# Patient Record
Sex: Male | Born: 1955 | Race: White | Hispanic: No | Marital: Married | Smoking: Never smoker
Health system: Southern US, Community
[De-identification: ages and names within clinical notes are randomized; demographics above are authoritative.]

## PROBLEM LIST (undated history)

## (undated) DIAGNOSIS — I10 Essential (primary) hypertension: Secondary | ICD-10-CM

## (undated) DIAGNOSIS — E0590 Thyrotoxicosis, unspecified without thyrotoxic crisis or storm: Secondary | ICD-10-CM

## (undated) DIAGNOSIS — E785 Hyperlipidemia, unspecified: Secondary | ICD-10-CM

## (undated) HISTORY — DX: Thyrotoxicosis, unspecified without thyrotoxic crisis or storm: E05.90

## (undated) HISTORY — DX: Hyperlipidemia, unspecified: E78.5

## (undated) HISTORY — PX: DENTAL SURGERY: SHX609

## (undated) HISTORY — DX: Essential (primary) hypertension: I10

---

## 2005-04-14 ENCOUNTER — Ambulatory Visit (HOSPITAL_COMMUNITY): Admission: RE | Admit: 2005-04-14 | Discharge: 2005-04-16 | Payer: Self-pay | Admitting: Neurological Surgery

## 2005-11-14 HISTORY — PX: BACK SURGERY: SHX140

## 2012-12-11 ENCOUNTER — Encounter: Payer: Self-pay | Admitting: Pulmonary Disease

## 2012-12-11 ENCOUNTER — Ambulatory Visit (INDEPENDENT_AMBULATORY_CARE_PROVIDER_SITE_OTHER): Payer: 59 | Admitting: Pulmonary Disease

## 2012-12-11 VITALS — BP 112/70 | HR 75 | Temp 97.8°F | Ht 67.5 in | Wt 216.8 lb

## 2012-12-11 DIAGNOSIS — G4733 Obstructive sleep apnea (adult) (pediatric): Secondary | ICD-10-CM | POA: Insufficient documentation

## 2012-12-11 NOTE — Assessment & Plan Note (Signed)
The patient's history is very suspicious for clinically significant sleep apnea.  I had a long discussion with him about the pathophysiology of sleep apnea, including its impact to his quality of life and cardiovascular health.  I think it is very appropriate for him to have a sleep study at this time, and the patient is agreeable.

## 2012-12-11 NOTE — Patient Instructions (Addendum)
Will schedule for a sleep study.  Will arrange followup once results are available. Work on weight loss
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
## 2013-01-07 ENCOUNTER — Ambulatory Visit (HOSPITAL_BASED_OUTPATIENT_CLINIC_OR_DEPARTMENT_OTHER): Payer: 59

## 2013-01-11 ENCOUNTER — Telehealth: Payer: Self-pay | Admitting: Pulmonary Disease
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
## 2013-01-14 ENCOUNTER — Encounter (HOSPITAL_BASED_OUTPATIENT_CLINIC_OR_DEPARTMENT_OTHER): Payer: 59

## 2013-01-14 NOTE — Telephone Encounter (Signed)
Called and spoke with [facility] and set up a peer to peer appeal to be done by Rubye Oaks, NP today, between 11:30 - 12:30 est. [facility] Medical Director will contact [phone]. Reference # [id number].  Pt's wife is aware of the appeal and that we will contact her once the decision has been made by [facility]. Rhonda J Cobb

## 2013-01-14 NOTE — Telephone Encounter (Signed)
Called [employer] and re-arranged appeal time from 3:00 pm to 4:00 pm to work better with Rubye Oaks, NP schedule. Rhonda J Cobb

## 2013-01-14 NOTE — Telephone Encounter (Signed)
Peer to peer done Denied per TP Will forward back Alexander Jones I still have the paperwork

## 2013-01-15 NOTE — Telephone Encounter (Signed)
Let pt know about this.  We will do a home sleep test, BUT IF THIS IS NEGATIVE, he will need to have an in lab study for better accuracy, or I will not be able to rule out sleep apnea as a cause for his event.

## 2013-01-15 NOTE — Telephone Encounter (Signed)
Dr. Shelle Iron, Tammy P did the appeal with Dr. Pierre Bali (medical director at [facility]) and the in lab study ([employer]) was still denied after listing all the reasons why we were requesting the study to be preformed in the lab setting. Dr. Franky Macho stated that we could proceed with the home study and then send patient to the lab for CPAP titration.  Please advise. Thanks, Ollen Gross

## 2013-01-15 NOTE — Telephone Encounter (Signed)
Spoke with patient and he requested that I call [employer] and get the benefits of the home study and call him back to schedule. Explained to the patient that if we did the home sleep study and the home study didn't pick up any apnea events that we would need to further evaluate with a in lab study. If this is needed, and the home study was inclusive, then we could call [facility] and request the lab study. Pt understands and phone note sent back to Dr. Shelle Iron to order the home study. Will contact patient tomorrow to arrange. Rhonda J Cobb

## 2013-01-16 ENCOUNTER — Other Ambulatory Visit: Payer: Self-pay | Admitting: Pulmonary Disease

## 2013-01-16 DIAGNOSIS — G4733 Obstructive sleep apnea (adult) (pediatric): Secondary | ICD-10-CM

## 2013-01-22 NOTE — Telephone Encounter (Signed)
Per Bjorn Loser this has been taken care of

## 2013-01-22 NOTE — Telephone Encounter (Signed)
Spoke with pt's wife and patient. Pt concerned about cost of the studies. However, patient will pick up the home sleep study device, as per his [facility] has advised that we arrange on Monday 01/28/13. Advised patient and wife that hopefully this device will tell us if he has sleep apnea. If the study is negative, pt and wife have been informed that a in lab study may be necessary to evaluate any other explained events. At that point, we would contact [facility] and request in lab study. Nothing further needed at this time. Rhonda J Cobb

## 2013-02-01 ENCOUNTER — Telehealth: Payer: Self-pay | Admitting: Pulmonary Disease

## 2013-02-01 NOTE — Telephone Encounter (Signed)
Wife wanted to confirm the appointment to pick up the [facility] on Monday. Advised patient that we have patient scheduled to pick up the device on Monday 02/04/13 after 1:00. Nothing else needed. Rhonda J Cobb

## 2013-02-11 ENCOUNTER — Ambulatory Visit (INDEPENDENT_AMBULATORY_CARE_PROVIDER_SITE_OTHER): Payer: 59 | Admitting: Pulmonary Disease

## 2013-02-11 DIAGNOSIS — G4733 Obstructive sleep apnea (adult) (pediatric): Secondary | ICD-10-CM

## 2013-02-18 ENCOUNTER — Telehealth: Payer: Self-pay | Admitting: Pulmonary Disease

## 2013-02-18 NOTE — Telephone Encounter (Signed)
I spoke with spouse. She is wanting to know if [facility] [facility] results have been received. Please advise KC thanks

## 2013-02-19 NOTE — Telephone Encounter (Signed)
Pt needs ov asap to review sleep study.  ?this week?

## 2013-02-19 NOTE — Telephone Encounter (Signed)
Dr Shelle Iron please advise, thanks!

## 2013-02-20 ENCOUNTER — Telehealth: Payer: Self-pay | Admitting: Pulmonary Disease

## 2013-02-20 NOTE — Telephone Encounter (Signed)
Patient scheduled for 02/26/13 at 430 with KC--appt time okay with KC.

## 2013-02-20 NOTE — Telephone Encounter (Signed)
Spoke with the pt's spouse She states that the pt may not be able to afford his copay next wk when he comes in I advised that he can ask the girls up front to just bill his insurance for this She is still unsure if he will want to keep the appt, but will check with him and call us tomorrow Nothing further needed per spouse

## 2013-02-20 NOTE — Telephone Encounter (Signed)
Wife returning call.  She states patient has Tuesday's off and can only come in then.  Please advise.  [phone]

## 2013-02-20 NOTE — Telephone Encounter (Signed)
[id number]. KC had cancellation today, if this is not available when pt calls back then will need to check with ashtyn on work-in. Carron Curie, CMA

## 2013-02-20 NOTE — Telephone Encounter (Signed)
Please advise ashtyn thanks 

## 2013-02-26 ENCOUNTER — Ambulatory Visit (INDEPENDENT_AMBULATORY_CARE_PROVIDER_SITE_OTHER): Payer: 59 | Admitting: Pulmonary Disease

## 2013-02-26 ENCOUNTER — Encounter: Payer: Self-pay | Admitting: Pulmonary Disease

## 2013-02-26 VITALS — BP 128/82 | HR 71 | Temp 98.3°F | Ht 67.5 in | Wt 212.4 lb

## 2013-02-26 DIAGNOSIS — G4733 Obstructive sleep apnea (adult) (pediatric): Secondary | ICD-10-CM

## 2013-02-26 NOTE — Patient Instructions (Addendum)
Will start you on cpap at a moderate pressure level.  Please call if having issues with tolerance. Work on weight loss followup with me in 6 weeks.

## 2013-02-26 NOTE — Assessment & Plan Note (Signed)
The pt has moderate to severe osa by his recent sleep study, and would benefit from treatment with cpap while he is working on weight loss.  He is agreeable. I will set the patient up on cpap at a moderate pressure level to allow for desensitization, and will troubleshoot the device over the next 4-6weeks if needed.  The pt is to call me if having issues with tolerance.  Will then optimize the pressure once patient is able to wear cpap on a consistent basis.

## 2013-02-26 NOTE — Progress Notes (Signed)
  Subjective:    Patient ID: Alexander Jones, male    DOB: 1956/05/23, 57 y.o.   MRN: [id number]  HPI The patient comes in today for followup of his recent sleep testing.  He was found to have moderate to severe OSA, with an AHI of 30 events per hour.  I have reviewed the study with him in detail, and answered all of his questions.   Review of Systems  Constitutional: Negative for fever and unexpected weight change.  HENT: Negative for ear pain, nosebleeds, congestion, sore throat, rhinorrhea, sneezing, trouble swallowing, dental problem, postnasal drip and sinus pressure.   Eyes: Negative for redness and itching.  Respiratory: Negative for cough, chest tightness, shortness of breath and wheezing.   Cardiovascular: Negative for palpitations and leg swelling.  Gastrointestinal: Negative for nausea and vomiting.  Genitourinary: Negative for dysuria.  Musculoskeletal: Negative for joint swelling.  Skin: Negative for rash.  Neurological: Negative for headaches.  Hematological: Does not bruise/bleed easily.  Psychiatric/Behavioral: Positive for dysphoric mood. The patient is not nervous/anxious.        Objective:   Physical Exam Ow male in nad Nose without purulence or discharge noted. Neck without LN or TMG LE without edema, no cyanosis Alert, oriented, moves all 4.        Assessment & Plan:

## 2013-03-04 ENCOUNTER — Telehealth: Payer: Self-pay | Admitting: Pulmonary Disease

## 2013-03-04 NOTE — Telephone Encounter (Signed)
Spoke with patients spouse Informed her of recs as listed below States she will let her husband know this, let him decide and give [place] a call back.

## 2013-03-04 NOTE — Telephone Encounter (Signed)
Pt wife called and states that Insurance called him in regards to order placed for CPAP. Pt has met deductible and he will have to pay $800 out of pocket if he goes through with order and gets the CPAP.  Pt would like to know what there options there are in place of the CPAP.

## 2013-03-04 NOTE — Telephone Encounter (Signed)
He has moderate sleep apnea, typically treated best with cpap and weight loss. He could try dental appliance made by a dentist, but may also cost 1500-2000 as well.  Sometime covered by insurance, sometime not.  I can refer him to a dentist who makes a lot of these if he would like to try it.

## 2013-03-06 ENCOUNTER — Telehealth: Payer: Self-pay | Admitting: Pulmonary Disease

## 2013-03-06 DIAGNOSIS — G4733 Obstructive sleep apnea (adult) (pediatric): Secondary | ICD-10-CM

## 2013-03-06 NOTE — Telephone Encounter (Signed)
Spoke with pt's spouse to check on this She states that the pt has not given this any thought yet, and will call back once he does I am closing this encounter

## 2013-03-06 NOTE — Telephone Encounter (Signed)
Called spoke with Alexander Jones Pt has a family member that is no longer using their CPAP machine Would like to know if this would be an option (advised that they will be responsible for the mask and tubing) They have not seen the machine so do not know they type or age Alexander Jones is okay with them bringing the machine in for [facility] to lookat if needed Pt does not want to try the dental appliance Alexander Jones is okay with a call back tomorrow Dr Shelle Iron please advise, thanks!

## 2013-03-06 NOTE — Telephone Encounter (Signed)
It is ok with me provided the dme check machine thoroughly and makes sure is in working order and sets on the ordered pressure. Let [facility] know so they can communicate this to dme.   Thanks.

## 2013-03-07 NOTE — Telephone Encounter (Signed)
ATC Pat to inform her of KC recs No answer [id number] Will forward to [facility]

## 2013-03-07 NOTE — Telephone Encounter (Signed)
Spoke to pt ans [employer] pt has a very high deductable but [employer] will be glad to ck out his cpap and get his supplies i need an order to ck cpap for working properly and get hi supplies thanks  [employer]

## 2013-03-08 NOTE — Telephone Encounter (Signed)
Order has been placed. Alexander Jones is aware and has also spoken with [facility]. Will take machine to [employer] to have it checked. Nothing further needed at this time.

## 2013-04-09 ENCOUNTER — Ambulatory Visit: Payer: 59 | Admitting: Pulmonary Disease

## 2014-12-30 ENCOUNTER — Telehealth: Payer: Self-pay | Admitting: Family Medicine

## 2014-12-30 NOTE — Telephone Encounter (Signed)
Patient states he would be new here and that he fell at work 2 months ago and his leg is swelling. Patient states this would not be workers comp and that he saw his primary care for it but it is getting worse. I advised patient that he should follow up with his primary care since they originally saw him for the visit.

## 2019-12-05 ENCOUNTER — Other Ambulatory Visit: Payer: Self-pay

## 2019-12-30 ENCOUNTER — Ambulatory Visit: Payer: Self-pay

## 2019-12-31 ENCOUNTER — Other Ambulatory Visit: Payer: Self-pay

## 2019-12-31 ENCOUNTER — Ambulatory Visit: Payer: Self-pay | Attending: Internal Medicine

## 2019-12-31 DIAGNOSIS — Z20822 Contact with and (suspected) exposure to covid-19: Secondary | ICD-10-CM | POA: Insufficient documentation

## 2020-01-01 ENCOUNTER — Ambulatory Visit: Payer: Self-pay

## 2020-01-01 LAB — NOVEL CORONAVIRUS, NAA: SARS-CoV-2, NAA: NOT DETECTED

## 2022-10-10 ENCOUNTER — Encounter (HOSPITAL_COMMUNITY): Payer: Self-pay | Admitting: *Deleted

## 2022-10-10 ENCOUNTER — Other Ambulatory Visit: Payer: Self-pay

## 2022-10-10 ENCOUNTER — Emergency Department (HOSPITAL_COMMUNITY)
Admission: EM | Admit: 2022-10-10 | Discharge: 2022-10-10 | Disposition: A | Discharge status: Home / Self Care | Payer: Worker's Compensation | Attending: Emergency Medicine | Admitting: Emergency Medicine

## 2022-10-10 DIAGNOSIS — S91311A Laceration without foreign body, right foot, initial encounter: Secondary | ICD-10-CM | POA: Insufficient documentation

## 2022-10-10 DIAGNOSIS — Z23 Encounter for immunization: Secondary | ICD-10-CM | POA: Insufficient documentation

## 2022-10-10 DIAGNOSIS — W312XXA Contact with powered woodworking and forming machines, initial encounter: Secondary | ICD-10-CM | POA: Insufficient documentation

## 2022-10-10 DIAGNOSIS — S91012A Laceration without foreign body, left ankle, initial encounter: Secondary | ICD-10-CM | POA: Diagnosis not present

## 2022-10-10 DIAGNOSIS — Y990 Civilian activity done for income or pay: Secondary | ICD-10-CM | POA: Diagnosis not present

## 2022-10-10 DIAGNOSIS — S91319A Laceration without foreign body, unspecified foot, initial encounter: Secondary | ICD-10-CM

## 2022-10-10 DIAGNOSIS — S99921A Unspecified injury of right foot, initial encounter: Secondary | ICD-10-CM | POA: Diagnosis present

## 2022-10-10 MED ORDER — TETANUS-DIPHTH-ACELL PERTUSSIS 5-2.5-18.5 LF-MCG/0.5 IM SUSY
0.5000 mL | PREFILLED_SYRINGE | Freq: Once | INTRAMUSCULAR | Status: AC
  Administered 2022-10-10: 0.5 mL via INTRAMUSCULAR
  Filled 2022-10-10: qty 0.5

## 2022-10-10 MED ORDER — LIDOCAINE-EPINEPHRINE 2 %-1:100000 IJ SOLN: 20.0000 mL | Freq: Once | INTRAMUSCULAR | Status: DC

## 2022-10-10 MED ORDER — TETANUS-DIPHTH-ACELL PERTUSSIS 5-2.5-18.5 LF-MCG/0.5 IM SUSY
0.5000 mL | PREFILLED_SYRINGE | Freq: Once | INTRAMUSCULAR | Status: DC
  Filled 2022-10-10: qty 0.5

## 2022-10-10 MED ORDER — LIDOCAINE-EPINEPHRINE (PF) 2 %-1:200000 IJ SOLN
INTRAMUSCULAR | Status: AC
  Administered 2022-10-10: 20 mL
  Filled 2022-10-10: qty 20

## 2022-10-10 NOTE — ED Provider Notes (Signed)
[facility] EMERGENCY DEPARTMENT Provider Note   CSN: [id number] Arrival date & time: 10/10/22  1114     History  Chief Complaint  Patient presents with   Laceration    Alexander Jones is a 66 y.o. male [name] for evaluation of laceration to both feet.  Patient states that he was sawing a metal table leg with when the saw caught, bounced off the table hit the top of his right foot and then the angle of his left foot.  He denies numbness or tingling.  He is unsure of his last tetanus vaccination.  bl foot laceration from saw.  Saw cut through the top of his shoe   Laceration      Home Medications Prior to Admission medications   Medication Sig Start Date End Date Taking? Authorizing Provider  simvastatin (ZOCOR) 40 MG tablet  10/12/12   [provider]      Allergies    Patient has no allergy information on record.    Review of Systems   Review of Systems Laceration Physical Exam Updated Vital Signs BP (!) 144/89   Pulse 85   Temp 98.1 F (36.7 C) (Oral)   Resp 12   Ht 5\' 7"  (1.702 m)   Wt 104.3 kg   SpO2 96%   BMI 36.02 kg/m  Physical Exam Vitals and nursing note reviewed.  Constitutional:      General: He is not in acute distress.    Appearance: He is well-developed. He is not diaphoretic.  HENT:     Head: Normocephalic and atraumatic.  Eyes:     General: No scleral icterus.    Conjunctiva/sclera: Conjunctivae normal.  Cardiovascular:     Rate and Rhythm: Normal rate and regular rhythm.     Heart sounds: Normal heart sounds.  Pulmonary:     Effort: Pulmonary effort is normal. No respiratory distress.     Breath sounds: Normal breath sounds.  Abdominal:     Palpations: Abdomen is soft.     Tenderness: There is no abdominal tenderness.  Musculoskeletal:     Cervical back: Normal range of motion and neck supple.  Skin:    General: Skin is warm and dry.     Comments: Last lesion over the dorsum of the right midfoot.  No  obvious tendon injury, able to wiggle all toes, neurovascularly intact.  Superficial laceration of the posterior tibial region, left ankle.  Full range of motion of the ankle, no tendon or vascular injury noted, neurovascularly intact.  Neurological:     Mental Status: He is alert.  Psychiatric:        Behavior: Behavior normal.     ED Results / Procedures / Treatments   Labs (all labs ordered are listed, but only abnormal results are displayed) Labs Reviewed - No data to display  EKG None  Radiology No results found.  Procedures . Laceration Repair  Date/Time: 10/10/2022 7:04 PM  Performed by: 10/12/2022, PA-C Authorized by: Arthor Captain, PA-C   Consent:    Consent obtained:  Verbal   Consent given by:  Patient   Risks discussed:  Infection, need for additional repair and poor cosmetic result Universal protocol:    Patient identity confirmed:  Verbally with patient Anesthesia:    Anesthesia method:  Local infiltration   Local anesthetic:  Lidocaine 2% WITH epi Laceration details:    Location:  Foot   Foot location:  Top of R foot   Length (cm):  4  Laceration depth: Superficial. Exploration:    Wound exploration: wound explored through full range of motion and entire depth of wound visualized     Wound extent: no tendon damage   Treatment:    Area cleansed with:  Povidone-iodine   Amount of cleaning:  Standard   Irrigation solution:  Sterile saline   Irrigation method:  Syringe Skin repair:    Repair method:  Sutures   Suture size:  4-0   Suture material:  Prolene   Suture technique:  Running locked   Number of sutures:  3 Repair type:    Repair type:  Simple Post-procedure details:    Dressing:  Non-adherent dressing   Procedure completion:  Tolerated well, no immediate complications .Marland KitchenLaceration Repair  Date/Time: 10/10/2022 7:07 PM  Performed by: Arthor Captain, PA-C Authorized by: Arthor Captain, PA-C   Consent:    Consent obtained:   Verbal   Risks discussed:  Infection, pain and need for additional repair Universal protocol:    Patient identity confirmed:  Verbally with patient Anesthesia:    Anesthesia method:  Local infiltration   Local anesthetic:  Lidocaine 2% WITH epi Laceration details:    Location:  Foot   Foot location:  L ankle   Length (cm):  2 Exploration:    Wound extent: no foreign body, no signs of injury, no nerve damage, no tendon damage and no vascular damage   Treatment:    Area cleansed with:  Povidone-iodine   Amount of cleaning:  Standard   Irrigation solution:  Sterile water   Irrigation method:  Pressure wash   Debridement:  None Skin repair:    Repair method:  Sutures   Suture size:  4-0   Suture material:  Prolene   Suture technique:  Simple interrupted   Number of sutures:  2 Approximation:    Approximation:  Close Repair type:    Repair type:  Simple Post-procedure details:    Dressing:  Adhesive bandage   Procedure completion:  Tolerated well, no immediate complications     Medications Ordered in ED Medications  lidocaine-EPINEPHrine (XYLOCAINE W/EPI) 2 %-1:200000 (PF) injection (has no administration in time range)  Tdap (BOOSTRIX) injection 0.5 mL (has no administration in time range)    ED Course/ Medical Decision Making/ A&P                           Medical Decision Making Alexander Jones is a 66 y.o. male who presents to ED for laceration of both feet. Wound thoroughly cleaned in ED today. Wound explored and bottom of wound seen in a bloodless field. Laceration repaired as dictated above. Patient counseled on home wound care. Follow up with PCP/urgent care or return to ER for suture removal in 10 days. Patient was urged to return to the Emergency Department for worsening pain, swelling, expanding erythema especially if it streaks away from the affected area, fever, or for any additional concerns. Patient verbalized understanding. All questions  answered.   Risk Prescription drug management.           Final Clinical Impression(s) / ED Diagnoses Final diagnoses:  None    Rx / DC Orders ED Discharge Orders     None         Arthor Captain, PA-C 10/12/22 1301    Ernie Avena, MD 10/22/22 2105

## 2022-10-10 NOTE — ED Triage Notes (Signed)
Patient states he was using a metal grinder and it hit his fell, laceration to top of right foot and laceration to inner aapect of left foot, bleeding controled.

## 2022-10-10 NOTE — Discharge Instructions (Signed)
WOUND CARE Please have your stitches/staples removed in 10 days  or sooner if you have concerns. You may do this at any available urgent care or at your primary care doctor's office.  Keep area clean and dry for 24 hours. Do not remove bandage, if applied.  After 24 hours, remove bandage and wash wound gently with mild soap and warm water. Reapply a new bandage after cleaning wound, if directed.  Continue daily cleansing with soap and water until stitches/staples are removed.  Do not apply any ointments or creams to the wound while stitches/staples are in place, as this may cause delayed healing.  Seek medical careif you experience any of the following signs of infection: Swelling, redness, pus drainage, streaking, fever >101.0 F  Seek care if you experience excessive bleeding that does not stop after 15-20 minutes of constant, firm pressure.
